# Patient Record
Sex: Male | Born: 1970 | Race: Asian | Hispanic: No | Marital: Married | Smoking: Never smoker
Health system: Southern US, Community
[De-identification: ages and names within clinical notes are randomized; demographics above are authoritative.]

## PROBLEM LIST (undated history)

## (undated) DIAGNOSIS — I2510 Atherosclerotic heart disease of native coronary artery without angina pectoris: Secondary | ICD-10-CM

## (undated) DIAGNOSIS — G4730 Sleep apnea, unspecified: Secondary | ICD-10-CM

## (undated) DIAGNOSIS — E119 Type 2 diabetes mellitus without complications: Secondary | ICD-10-CM

## (undated) DIAGNOSIS — E0590 Thyrotoxicosis, unspecified without thyrotoxic crisis or storm: Secondary | ICD-10-CM

## (undated) DIAGNOSIS — E785 Hyperlipidemia, unspecified: Secondary | ICD-10-CM

## (undated) HISTORY — DX: Type 2 diabetes mellitus without complications: E11.9

## (undated) HISTORY — DX: Atherosclerotic heart disease of native coronary artery without angina pectoris: I25.10

## (undated) HISTORY — DX: Thyrotoxicosis, unspecified without thyrotoxic crisis or storm: E05.90

## (undated) HISTORY — DX: Sleep apnea, unspecified: G47.30

## (undated) HISTORY — PX: UVULOPALATOPHARYNGOPLASTY: SHX827

## (undated) HISTORY — DX: Hyperlipidemia, unspecified: E78.5

---

## 2008-03-31 ENCOUNTER — Encounter (INDEPENDENT_AMBULATORY_CARE_PROVIDER_SITE_OTHER): Payer: Self-pay | Admitting: Otolaryngology

## 2008-03-31 ENCOUNTER — Ambulatory Visit (HOSPITAL_COMMUNITY): Admission: RE | Admit: 2008-03-31 | Discharge: 2008-04-01 | Payer: Self-pay | Admitting: *Deleted

## 2010-06-15 ENCOUNTER — Emergency Department (INDEPENDENT_AMBULATORY_CARE_PROVIDER_SITE_OTHER): Payer: Commercial Managed Care - PPO

## 2010-06-15 ENCOUNTER — Emergency Department (HOSPITAL_BASED_OUTPATIENT_CLINIC_OR_DEPARTMENT_OTHER)
Admission: EM | Admit: 2010-06-15 | Discharge: 2010-06-15 | Disposition: A | Discharge status: Home / Self Care | Payer: Commercial Managed Care - PPO | Attending: Emergency Medicine | Admitting: Emergency Medicine

## 2010-06-15 DIAGNOSIS — R109 Unspecified abdominal pain: Secondary | ICD-10-CM | POA: Insufficient documentation

## 2010-06-15 DIAGNOSIS — G4730 Sleep apnea, unspecified: Secondary | ICD-10-CM | POA: Insufficient documentation

## 2010-06-15 DIAGNOSIS — N201 Calculus of ureter: Secondary | ICD-10-CM

## 2010-06-15 DIAGNOSIS — N1330 Unspecified hydronephrosis: Secondary | ICD-10-CM | POA: Insufficient documentation

## 2010-06-15 DIAGNOSIS — N200 Calculus of kidney: Secondary | ICD-10-CM | POA: Insufficient documentation

## 2010-06-15 LAB — URINALYSIS, ROUTINE W REFLEX MICROSCOPIC
Leukocytes, UA: NEGATIVE
Specific Gravity, Urine: 1.024 (ref 1.005–1.030)
Urine Glucose, Fasting: NEGATIVE mg/dL
pH: 6 (ref 5.0–8.0)

## 2010-06-15 LAB — URINE MICROSCOPIC-ADD ON

## 2010-06-21 ENCOUNTER — Emergency Department (INDEPENDENT_AMBULATORY_CARE_PROVIDER_SITE_OTHER): Payer: Commercial Managed Care - PPO

## 2010-06-21 ENCOUNTER — Emergency Department (HOSPITAL_BASED_OUTPATIENT_CLINIC_OR_DEPARTMENT_OTHER)
Admission: EM | Admit: 2010-06-21 | Discharge: 2010-06-22 | Disposition: A | Discharge status: Home / Self Care | Payer: Commercial Managed Care - PPO | Attending: Emergency Medicine | Admitting: Emergency Medicine

## 2010-06-21 DIAGNOSIS — G4730 Sleep apnea, unspecified: Secondary | ICD-10-CM | POA: Insufficient documentation

## 2010-06-21 DIAGNOSIS — N201 Calculus of ureter: Secondary | ICD-10-CM

## 2010-06-21 DIAGNOSIS — R109 Unspecified abdominal pain: Secondary | ICD-10-CM

## 2010-06-21 DIAGNOSIS — N1330 Unspecified hydronephrosis: Secondary | ICD-10-CM | POA: Insufficient documentation

## 2010-06-21 LAB — BASIC METABOLIC PANEL
BUN: 10 mg/dL (ref 6–23)
Calcium: 9.2 mg/dL (ref 8.4–10.5)
Creatinine, Ser: 0.9 mg/dL (ref 0.4–1.5)
GFR calc non Af Amer: >60 mL/min (ref >60)
Glucose, Bld: 94 mg/dL (ref 70–99)
Potassium: 4.3 mEq/L (ref 3.5–5.1)

## 2010-06-21 MED ORDER — IOHEXOL 300 MG/ML  SOLN
100.0000 mL | Freq: Once | INTRAMUSCULAR | Status: AC | PRN
  Administered 2010-06-21: 100 mL via INTRAVENOUS

## 2010-06-22 LAB — URINALYSIS, ROUTINE W REFLEX MICROSCOPIC
Bilirubin Urine: NEGATIVE
Ketones, ur: 15 mg/dL — AB
Nitrite: NEGATIVE
pH: 6.5 (ref 5.0–8.0)

## 2010-09-10 NOTE — Op Note (Signed)
[name], Dennis Whitehead               ACCOUNT NO.:  [id number]   MEDICAL RECORD NO.:  [id number]          PATIENT TYPE:  OIB   LOCATION:  3307                         FACILITY:  [facility]   PHYSICIAN:  Kristine Garbe. Ezzard Standing, M.D.DATE OF BIRTH:  1970/12/13   DATE OF PROCEDURE:  03/31/2008  DATE OF DISCHARGE:                               OPERATIVE REPORT   PREOPERATIVE DIAGNOSES:  1. Obstructive sleep apnea.  2. Turbinate hypertrophy.   POSTOPERATIVE DIAGNOSES:  1. Obstructive sleep apnea.  2. Turbinate hypertrophy.   OPERATION PERFORMED:  Uvulopalatopharyngoplasty with tonsillectomy.  Bilateral inferior turbinate reductions.   SURGEON:  Kristine Garbe. Ezzard Standing, MD   ANESTHESIA:  General endotracheal.   COMPLICATIONS:  None.   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  He had a sleep test in January,  which showed apnea-hypopnea index of 80 and is presently using CPAP.  He  has lost some weight and wanted to see about having surgical procedure  to help improve his upper airway obstruction.  On exam, he has mild-to-  moderate septal deformity but large turbinates.  No polyps.  No  intranasal lesions noted.  Oral exam reveals general-sized tonsils along  the uvula and soft palate.  He was taken to the operating room this time  for uvulopalatopharyngoplasty with tonsillectomy and turbinate reduction  help improve his nasal airway.   DESCRIPTION OF PROCEDURE:  After adequate endotracheal anesthesia, the  patient received 10 mg of Decadron and 1 g Ancef IV preoperatively.  Nose was examined first.  Dennis Whitehead had a moderate septal deformity to the  left.  He had compensatory turbinate hypertrophy on the right side.  Using bipolar LMA cautery, submucosal cauterization of the inferior  turbinates were performed bilaterally.  The turbinates were  outfractured.  This completed the inferior turbinate reductions.  Following  this, a mouthgag was used to expose the oropharynx.  The left  and right tonsils were resected from tonsillar fossas using cautery.  After resecting the tonsils, the uvula was transected at its base.  About another 0.5 cm of soft palate was resected on either side of the  uvula.  In addition, there was a small papilloma on the right posterior  pharyngeal wall at the inferior aspect of the tonsil and this was  cauterized and burned.  Hemostasis was obtained with a cautery.  The  mucosa on either side of the palate was reapproximated with 3-0 Vicryl  and 3-0 chromic sutures.  Dennis Whitehead was awoke from anesthesia and  transferred to recovery room, postop doing well.   DISPOSITION:  Dennis Whitehead observed overnight for any airway problems and we  will plan to discharge in the morning on amoxicillin suspension 5 mg  b.i.d. for 1 week and Lortab Elixir 1 to 1-1/2 tablespoons q.4 h. p.r.n.  pain.  Further followup my office in 2 weeks for recheck.           ______________________________  Kristine Garbe. Ezzard Standing, M.D.     CEN/MEDQ  D:  03/31/2008  T:  03/31/2008  Job:  [id number]  cc:   Deirdre Peer. Polite, M.D.

## 2011-01-31 LAB — BASIC METABOLIC PANEL
CO2: 27 mEq/L (ref 19–32)
Chloride: 102 mEq/L (ref 96–112)
Creatinine, Ser: 0.95 mg/dL (ref 0.4–1.5)
GFR calc Af Amer: >60 mL/min (ref >60)

## 2011-01-31 LAB — CBC
Hemoglobin: 17.5 g/dL — ABNORMAL HIGH (ref 13.0–17.0)
MCHC: 33.4 g/dL (ref 30.0–36.0)
MCV: 92.2 fL (ref 78.0–100.0)
RBC: 5.68 MIL/uL (ref 4.22–5.81)

## 2011-01-31 LAB — PROTIME-INR: Prothrombin Time: 12.5 seconds (ref 11.6–15.2)

## 2012-04-09 IMAGING — CT CT ABD-PELV W/ CM
2 of 5 series · 16 of 46 positions shown, 18 images · IV contrast (APPLIED)
Comparison: Stone study of 06/15/2009

CLINICAL DATA: Lithotripsy today.  Left-sided flank pain.  Evaluate
for ureteral hematoma.

CT ABDOMEN AND PELVIS WITH CONTRAST
TECHNIQUE: Multidetector CT imaging of the abdomen and pelvis was
performed following the standard protocol during bolus
administration of intravenous contrast.
Contrast: 100  ml 9mnipaque-9PP

[Series 2: abd/pelvis 5.0 b31f · axial · 0.73mm/px · z∈[-521,-66]mm · 13 of 105 slices shown, 15 images]
[im 7/105  soft-tissue]
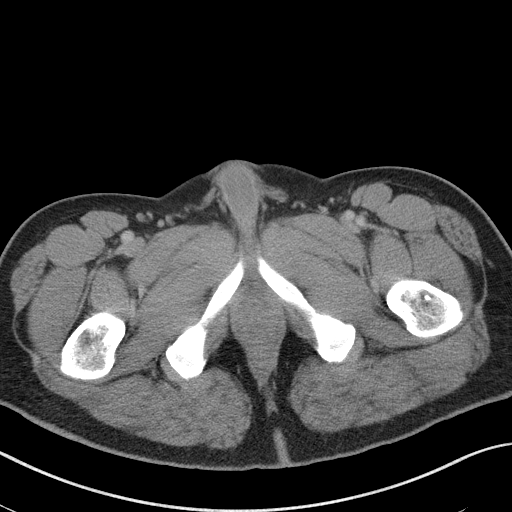
[im 7/105  bone]
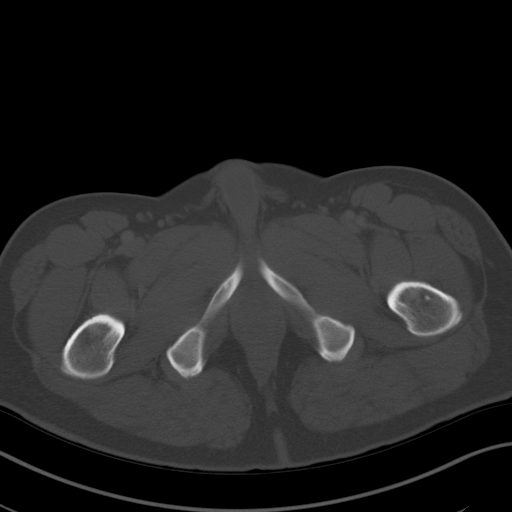
[im 14/105  soft-tissue]
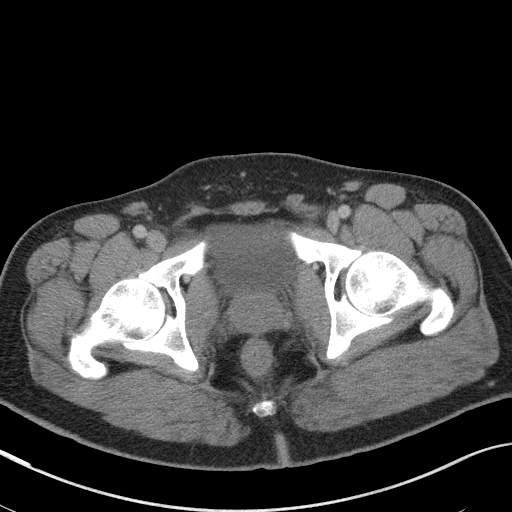
[im 21/105  soft-tissue]
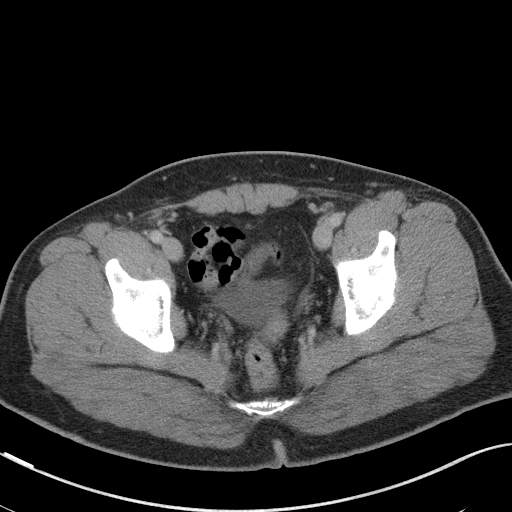
[im 28/105  soft-tissue]
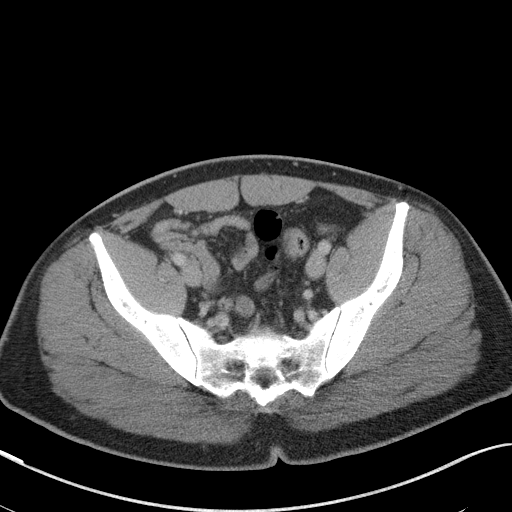
[im 35/105  soft-tissue]
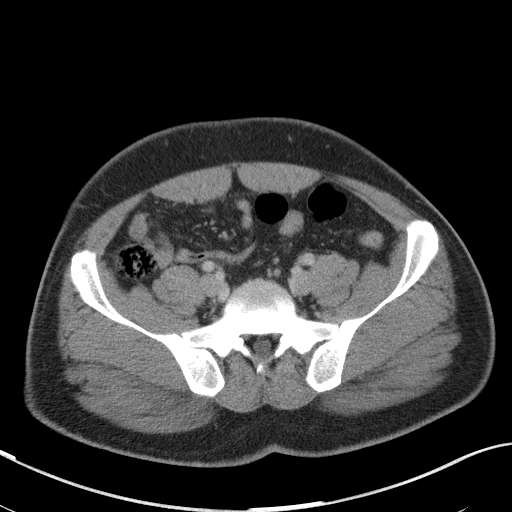
[im 42/105  soft-tissue]
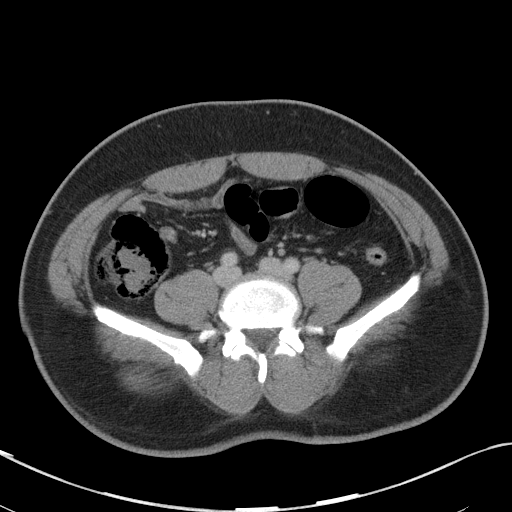
[im 56/105  soft-tissue]
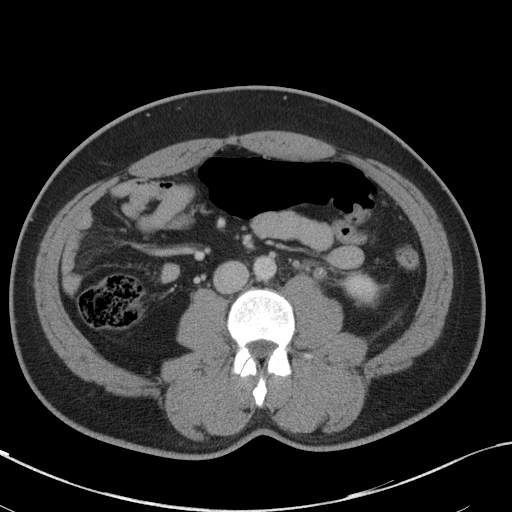
[im 63/105  soft-tissue]
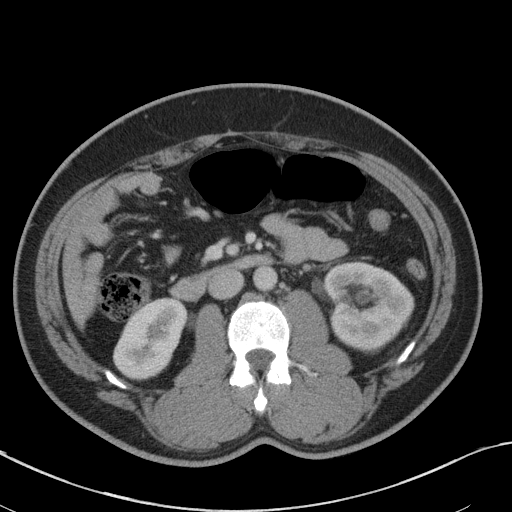
[im 70/105  soft-tissue]
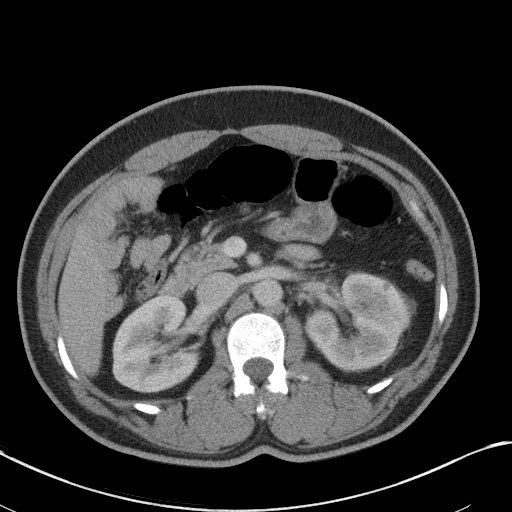
[im 70/105  bone]
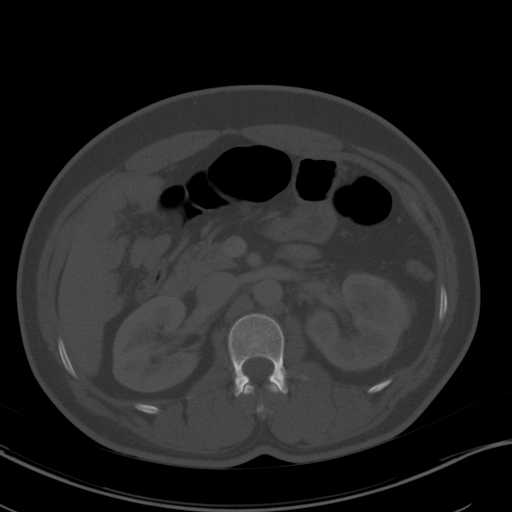
[im 77/105  soft-tissue]
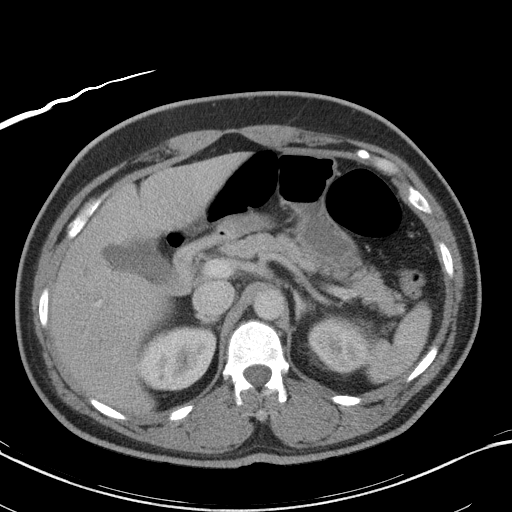
[im 84/105  soft-tissue]
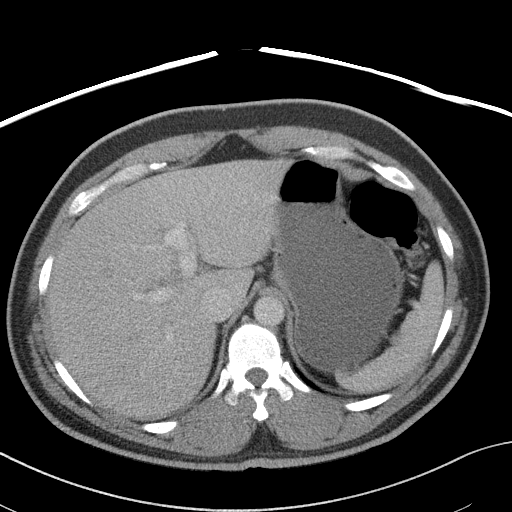
[im 91/105  soft-tissue]
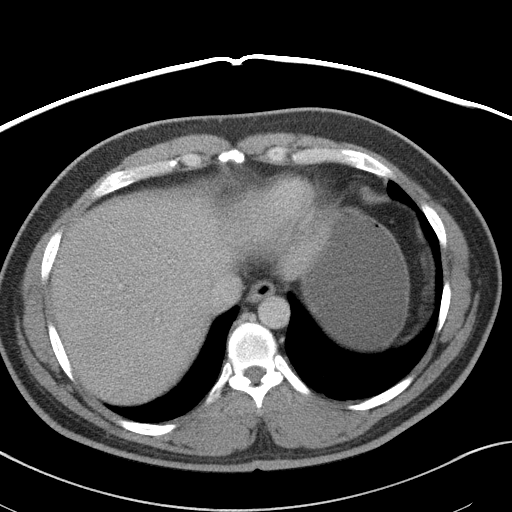
[im 98/105  soft-tissue]
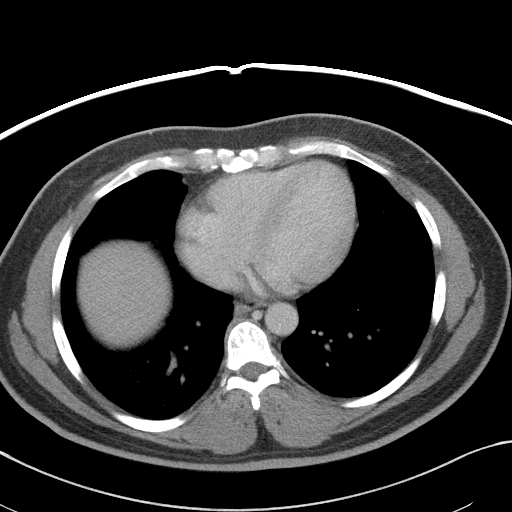

[Series 5: abd/pelvis 3.0 coronal · coronal · 0.87mm/px · 3 of 91 slices shown]
[im 31/91  soft-tissue]
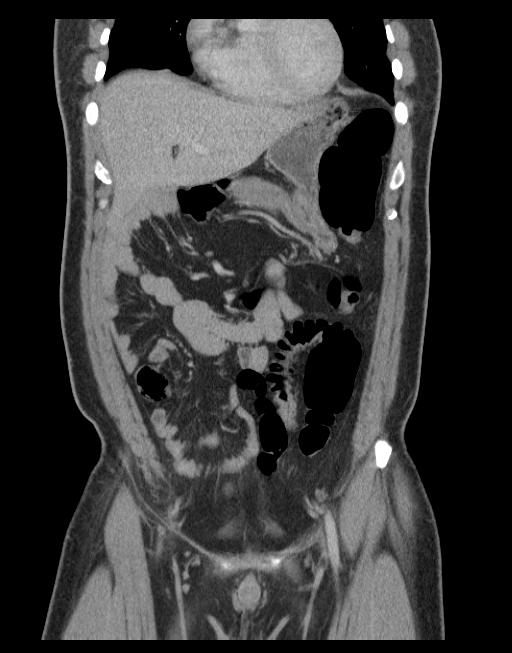
[im 41/91  soft-tissue]
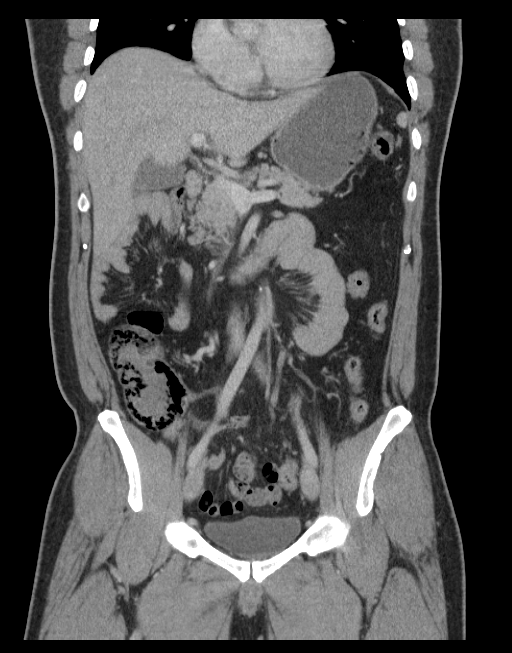
[im 51/91  soft-tissue]
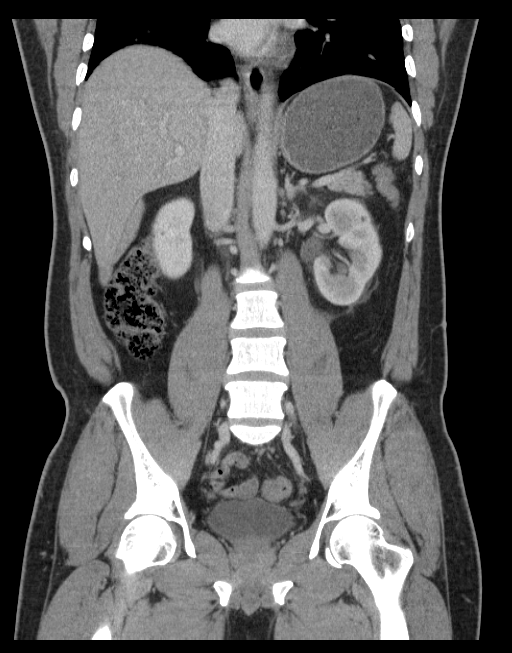

[16 of 46 positions shown; findings below may reference images not displayed]

FINDINGS: Clear lung bases.  Normal heart size without pericardial
or pleural effusion.  Normal liver, spleen, stomach, pancreas,
gallbladder, biliary tract, adrenal glands, right kidney.  Left
perienteric edema is slightly increased.  Sub cm low density left
renal lesions which are too small to characterize.

Mild left-sided urinary tract obstruction and  delayed contrast
excretion.  There are now numerous small stone fragments within the
proximal left ureter.  Images 49 - 51.  The largest measures 4 mm.
No definite distal ureteric stone.

No retroperitoneal or retrocrural adenopathy.

Normal colon.  There are small bowel loops which are positioned
anteriorly within the right abdomen on image 47.  No obstruction.
No ascites. No pelvic adenopathy.    Normal urinary bladder and
prostate.  No significant free fluid.  No acute osseous
abnormality.
IMPRESSION: 1.  Interval fragmentation of the proximal left ureteric stone.
Stone fragments are identified in the proximal left ureter with
increased urinary tract obstruction and delayed contrast excretion.
2.  No evidence of perinephric hematoma or other acute
complication.
3.  Atypical superficial/lateral position of small bowel loops in
the right abdomen suggests internal hernia.  No evidence of
obstruction or other acute complication.

## 2017-09-28 ENCOUNTER — Ambulatory Visit
Admission: RE | Admit: 2017-09-28 | Discharge: 2017-09-28 | Disposition: A | Discharge status: Home / Self Care | Payer: Commercial Managed Care - PPO | Source: Ambulatory Visit | Attending: Emergency Medicine | Admitting: Emergency Medicine

## 2017-09-28 ENCOUNTER — Other Ambulatory Visit: Payer: Self-pay | Admitting: Emergency Medicine

## 2017-09-28 DIAGNOSIS — M545 Low back pain: Principal | ICD-10-CM

## 2017-09-28 DIAGNOSIS — G8929 Other chronic pain: Secondary | ICD-10-CM

## 2017-09-28 MED ORDER — METHYLPREDNISOLONE ACETATE 40 MG/ML INJ SUSP (RADIOLOG
120.0000 mg | Freq: Once | INTRAMUSCULAR | Status: AC
  Administered 2017-09-28: 120 mg via EPIDURAL

## 2017-09-28 MED ORDER — IOPAMIDOL (ISOVUE-M 200) INJECTION 41%
1.0000 mL | Freq: Once | INTRAMUSCULAR | Status: AC
  Administered 2017-09-28: 1 mL via EPIDURAL

## 2017-09-28 NOTE — Discharge Instructions (Signed)

## 2022-03-24 ENCOUNTER — Ambulatory Visit: Payer: BC Managed Care – PPO | Attending: Cardiology | Admitting: Cardiology

## 2022-03-24 ENCOUNTER — Encounter: Payer: Self-pay | Admitting: Cardiology

## 2022-03-24 VITALS — BP 110/80 | HR 84 | Ht 74.0 in | Wt 177.0 lb

## 2022-03-24 DIAGNOSIS — E785 Hyperlipidemia, unspecified: Secondary | ICD-10-CM

## 2022-03-24 DIAGNOSIS — G4733 Obstructive sleep apnea (adult) (pediatric): Secondary | ICD-10-CM

## 2022-03-24 DIAGNOSIS — E119 Type 2 diabetes mellitus without complications: Secondary | ICD-10-CM | POA: Diagnosis not present

## 2022-03-24 DIAGNOSIS — R072 Precordial pain: Secondary | ICD-10-CM

## 2022-03-24 DIAGNOSIS — I10 Essential (primary) hypertension: Secondary | ICD-10-CM | POA: Diagnosis not present

## 2022-03-24 DIAGNOSIS — I2510 Atherosclerotic heart disease of native coronary artery without angina pectoris: Secondary | ICD-10-CM

## 2022-03-24 MED ORDER — METOPROLOL TARTRATE 100 MG PO TABS: 100.0000 mg | ORAL_TABLET | Freq: Once | ORAL | 0 refills | Status: DC

## 2022-03-24 NOTE — Progress Notes (Unsigned)
Cardiology Consultation:    Date:  03/24/2022   ID:  Dennis Whitehead, DOB July 24, 1970, MRN [id number]  PCP:  Patient, No Pcp Per  Cardiologist:  Jenne Campus, MD   Referring MD: No ref. provider found   No chief complaint on file. I am having some shortness of breath and atypical chest pain  History of Present Illness:    Dennis Whitehead is a 51 y.o. male who is being seen today for the evaluation of shortness of breath chest pain at the request of No ref. provider found.  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  Possible to go history significant for coronary artery disease.  In November 2021 he had cardiac catheterization done which showed 30% diagonal branch disease and 35% of proximal circumflex.  Additional problem include diabetes, dyslipidemia, obesity, obstructive sleep apnea. He came to me because he noticed more shortness of breath as well as some atypical chest pain not related to exercise.  With his known history of coronary artery disease as well as multiple risk factors he would like to be checked make sure everything is fine.  He does not exercise on the regular basis.  There was some changes in his diabetic medications which led to significant weight loss and he is feeling much better but still have some symptoms and he is already about that He never smoked does have family history of coronary artery disease does not exercise on the regular basis trying to use a lower calorie intake diet   Past Medical History:  Diagnosis Date   CAD (coronary artery disease)    Diabetes type 2, controlled ([place])    Hyperlipidemia    Hyperthyroidism    Sleep apnea     Past Surgical History:  Procedure Laterality Date   UVULOPALATOPHARYNGOPLASTY Bilateral     Current Medications: Current Meds  Medication Sig   aspirin EC 81 MG tablet Take by mouth.   atorvastatin (LIPITOR) 80 MG tablet Take 80 mg by mouth daily.   glucose blood (ONETOUCH VERIO) test strip Use 3  times daily   Insulin Pen Needle (NOVOFINE) 32G X 6 MM MISC USE AS DIRECTED   levothyroxine (SYNTHROID) 25 MCG tablet Take 25 mcg by mouth daily before breakfast.   lisinopril (ZESTRIL) 5 MG tablet Take 1 tablet by mouth daily.   tadalafil (CIALIS) 5 MG tablet Take 5 mg by mouth daily as needed for erectile dysfunction.   tirzepatide (MOUNJARO) 15 MG/0.5ML Pen Inject 15 mg into the skin once a week.     Allergies:   Patient has no known allergies.   Social History   Socioeconomic History   Marital status: Married    Spouse name: Not on file   Number of children: Not on file   Years of education: Not on file   Highest education level: Not on file  Occupational History   Not on file  Tobacco Use   Smoking status: Never   Smokeless tobacco: Never  Vaping Use   Vaping Use: Never used  Substance and Sexual Activity   Alcohol use: Never   Drug use: Never   Sexual activity: Not on file  Other Topics Concern   Not on file  Social History Narrative   Not on file   Social Determinants of Health   Financial Resource Strain: Not on file  Food Insecurity: Not on file  Transportation Needs: Not on file  Physical Activity: Not on file  Stress: Not on file  Social  Connections: Not on file     Family History: The patient's family history includes Diabetes in his father; Stroke in his paternal grandfather. ROS:   Please see the history of present illness.    All 14 point review of systems negative except as described per history of present illness.  EKGs/Labs/Other Studies Reviewed:    The following studies were reviewed today: Cardiac catheterization reviewed from 2021  EKG:  EKG is  ordered today.  The ekg ordered today demonstrates normal sinus rhythm, normal P interval, normal QS complex duration fulgent no ST segment changes  Recent Labs: No results found for requested labs within last 365 days.  Recent Lipid Panel No results found for: "CHOL", "TRIG", "HDL", "CHOLHDL",  "VLDL", "LDLCALC", "LDLDIRECT"  Physical Exam:    VS:  BP 110/80 (BP Location: Right Arm, Patient Position: Sitting, Cuff Size: Normal)   Pulse 84   Ht 6\' 2"  (1.88 m)   Wt 177 lb (80.3 kg)   SpO2 99%   BMI 22.73 kg/m     Wt Readings from Last 3 Encounters:  03/24/22 177 lb (80.3 kg)     GEN:  Well nourished, well developed in no acute distress HEENT: Normal NECK: No JVD; No carotid bruits LYMPHATICS: No lymphadenopathy CARDIAC: RRR, no murmurs, no rubs, no gallops RESPIRATORY:  Clear to auscultation without rales, wheezing or rhonchi  ABDOMEN: Soft, non-tender, non-distended MUSCULOSKELETAL:  No edema; No deformity  SKIN: Warm and dry NEUROLOGIC:  Alert and oriented x 3 PSYCHIATRIC:  Normal affect   ASSESSMENT:    1. Coronary artery disease involving native coronary artery of native heart without angina pectoris   2. Type 2 diabetes mellitus without complication, without long-term current use of insulin (HCC)   3. Essential hypertension   4. Dyslipidemia   5. Precordial pain   6. Obstructive sleep apnea    PLAN:    In order of problems listed above:  Atypical symptoms in this gentleman with multiple risk factors for coronary artery disease and already established coronary artery disease.  I will ask him to have coronary CT angio to rule out worsening of the problem.  Will continue antiplatelet therapy as well as high intensity statin Dyslipidemia he is on high intense statin which I will continue.  He is target LDL is less than 70 any we already there.  However advised to start doing exercises on the regular basis which should help with cholesterol as well Obstructive sleep apnea with CPAP on the regular basis Diabetes much better controlled right now with hemoglobin A1c less than 6 followed by endocrinology   Medication Adjustments/Labs and Tests Ordered: Current medicines are reviewed at length with the patient today.  Concerns regarding medicines are outlined above.   No orders of the defined types were placed in this encounter.  No orders of the defined types were placed in this encounter.   Signed, 03/26/22, MD, [facility]. 03/24/2022 12:30 PM    [facility]

## 2022-03-24 NOTE — Patient Instructions (Signed)
Medication Instructions:  Your physician recommends that you continue on your current medications as directed. Please refer to the Current Medication list given to you today.  *If you need a refill on your cardiac medications before your next appointment, please call your pharmacy*   Lab Work: Your physician recommends that you return for lab work in:   Labs 1 week before CT: BMP  If you have labs (blood work) drawn today and your tests are completely normal, you will receive your results only by: [place] (if you have MyChart) OR A paper copy in the mail If you have any lab test that is abnormal or we need to change your treatment, we will call you to review the results.   Testing/Procedures:   Your cardiac CT will be scheduled at one of the below locations:   [facility] [address] [place], [place] [address] [phone] [place] [address] [place], [place] [address] [phone]  [facility] [place],  [address] [phone]  If scheduled at [facility], please arrive at the [facility] and Children's Entrance (Entrance C2) of [facility] 30 minutes prior to test start time. You can use the FREE valet parking offered at entrance C (encouraged to control the heart rate for the test)  Proceed to the [facility] Radiology Department (first floor) to check-in and test prep.  All radiology patients and guests should use entrance C2 at [facility], accessed from [facility], even though the hospital's physical address listed is [address]    If scheduled at [facility] or [facility], please arrive 15 mins early for check-in and test prep.   Please follow these instructions carefully (unless otherwise directed):  Hold all erectile dysfunction  medications at least 3 days (72 hrs) prior to test. (Ie viagra, cialis, sildenafil, tadalafil, etc) We will administer nitroglycerin during this exam.   On the Night Before the Test: Be sure to Drink plenty of water. Do not consume any caffeinated/decaffeinated beverages or chocolate 12 hours prior to your test. Do not take any antihistamines 12 hours prior to your test.  On the Day of the Test: Drink plenty of water until 1 hour prior to the test. Do not eat any food 1 hour prior to test. You may take your regular medications prior to the test.  Take metoprolol (Lopressor) two hours prior to test.      After the Test: Drink plenty of water. After receiving IV contrast, you may experience a mild flushed feeling. This is normal. On occasion, you may experience a mild rash up to 24 hours after the test. This is not dangerous. If this occurs, you can take Benadryl 25 mg and increase your fluid intake. If you experience trouble breathing, this can be serious. If it is severe call 911 IMMEDIATELY. If it is mild, please call our office. If you take any of these medications: Glipizide/Metformin, Avandament, Glucavance, please do not take 48 hours after completing test unless otherwise instructed.  We will call to schedule your test 2-4 weeks out understanding that some insurance companies will need an authorization prior to the service being performed.   For non-scheduling related questions, please contact the cardiac imaging nurse navigator should you have any questions/concerns: Marchia Bond, Cardiac Imaging Nurse Navigator Gordy Clement, Cardiac Imaging Nurse Navigator  Heart  and Vascular Services Direct Office Dial: [phone]   For scheduling needs, including cancellations and rescheduling, please call [place], [phone].    Follow-Up: At [facility], you and your health needs are our priority.  As part of our continuing mission to provide you with  exceptional heart care, we have created designated Provider Care Teams.  These Care Teams include your primary Cardiologist (physician) and Advanced Practice Providers (APPs -  Physician Assistants and Nurse Practitioners) who all work together to provide you with the care you need, when you need it.  We recommend signing up for the patient portal called "MyChart".  Sign up information is provided on this After Visit Summary.  MyChart is used to connect with patients for Virtual Visits (Telemedicine).  Patients are able to view lab/test results, encounter notes, upcoming appointments, etc.  Non-urgent messages can be sent to your provider as well.   To learn more about what you can do with MyChart, go to [email or web address].    Your next appointment:   6 month(s)  The format for your next appointment:   In Person  Provider:   Gypsy Balsam, MD    Other Instructions None  Important Information About Sugar

## 2022-04-07 ENCOUNTER — Telehealth (HOSPITAL_COMMUNITY): Payer: Self-pay | Admitting: *Deleted

## 2022-04-07 NOTE — Telephone Encounter (Signed)
Reaching out to patient to offer assistance regarding upcoming cardiac imaging study; pt verbalizes understanding of appt date/time, parking situation and where to check in, medications ordered, and verified current allergies; name and call back number provided for further questions should they arise  Larey Brick RN Navigator Cardiac Imaging [facility] [phone] office [phone] cell  Patient aware to obtain labs prior to this cardiac CT scan.  He will take 100mg  metoprolol tartrate two hours prior to his cardiac CT scan and arrive at 9:30am.

## 2022-04-08 LAB — BASIC METABOLIC PANEL
BUN/Creatinine Ratio: 14 (ref 9–20)
BUN: 15 mg/dL (ref 6–24)
CO2: 25 mmol/L (ref 20–29)
Calcium: 9.8 mg/dL (ref 8.7–10.2)
Chloride: 101 mmol/L (ref 96–106)
Creatinine, Ser: 1.1 mg/dL (ref 0.76–1.27)
Glucose: 79 mg/dL (ref 70–99)
Potassium: 4.9 mmol/L (ref 3.5–5.2)
Sodium: 140 mmol/L (ref 134–144)
eGFR: 81 mL/min/{1.73_m2} (ref >59)

## 2022-04-09 ENCOUNTER — Telehealth: Payer: Self-pay

## 2022-04-09 ENCOUNTER — Ambulatory Visit (HOSPITAL_COMMUNITY)
Admission: RE | Admit: 2022-04-09 | Discharge: 2022-04-09 | Disposition: A | Discharge status: Home / Self Care | Payer: BC Managed Care – PPO | Source: Ambulatory Visit | Attending: Cardiology | Admitting: Cardiology

## 2022-04-09 ENCOUNTER — Encounter (HOSPITAL_COMMUNITY): Payer: Self-pay

## 2022-04-09 DIAGNOSIS — R072 Precordial pain: Secondary | ICD-10-CM | POA: Insufficient documentation

## 2022-04-09 MED ORDER — METOPROLOL TARTRATE 5 MG/5ML IV SOLN
INTRAVENOUS | Status: AC
  Filled 2022-04-09: qty 10

## 2022-04-09 MED ORDER — DILTIAZEM HCL 25 MG/5ML IV SOLN
5.0000 mg | INTRAVENOUS | Status: DC | PRN
  Administered 2022-04-09 (×2): 5 mg via INTRAVENOUS

## 2022-04-09 MED ORDER — IVABRADINE HCL 5 MG PO TABS: ORAL_TABLET | ORAL | 0 refills | Status: AC

## 2022-04-09 MED ORDER — METOPROLOL TARTRATE 100 MG PO TABS: ORAL_TABLET | ORAL | 0 refills | Status: AC

## 2022-04-09 MED ORDER — DILTIAZEM HCL 25 MG/5ML IV SOLN
INTRAVENOUS | Status: AC
  Filled 2022-04-09: qty 5

## 2022-04-09 MED ORDER — NITROGLYCERIN 0.4 MG SL SUBL: 0.8000 mg | SUBLINGUAL_TABLET | Freq: Once | SUBLINGUAL | Status: DC

## 2022-04-09 MED ORDER — METOPROLOL TARTRATE 5 MG/5ML IV SOLN
5.0000 mg | INTRAVENOUS | Status: DC | PRN
  Administered 2022-04-09: 5 mg via INTRAVENOUS

## 2022-04-09 MED ORDER — IOHEXOL 350 MG/ML SOLN: 95.0000 mL | Freq: Once | INTRAVENOUS | Status: DC | PRN

## 2022-04-09 NOTE — Addendum Note (Signed)
Addended by: Baldo Ash D on: 04/09/2022 10:46 AM   Modules accepted: Orders

## 2022-04-09 NOTE — Progress Notes (Signed)
Took Metoprolol 100 mg this am at 0600. Sinus rhythm on arrival to Nurses's Station was 80-90 and no change with breath hold. BP 108 81. After Metoprolol 5mg  IV and Diltiazem 10mg  total IV BP 91/64. Feels fine. Dr called and notified re above. CT heart cancelled and will be rescheduled with Ivabradine and Metoprolol. Patient talked to Dr. on the phone re this. Bing Matter RN notified of this as well. Patient discharged walking and feels fine. Last BP 100/67.

## 2022-04-09 NOTE — Telephone Encounter (Signed)
Spoke with pt and explained Metoprolol and Ivabradine 2 hours prior to CT. Order sent to reschedule CT scan.

## 2022-04-10 ENCOUNTER — Ambulatory Visit (HOSPITAL_COMMUNITY)
Admission: RE | Admit: 2022-04-10 | Discharge: 2022-04-10 | Disposition: A | Discharge status: Home / Self Care | Payer: BC Managed Care – PPO | Source: Ambulatory Visit | Attending: Cardiology | Admitting: Cardiology

## 2022-04-10 ENCOUNTER — Ambulatory Visit (HOSPITAL_BASED_OUTPATIENT_CLINIC_OR_DEPARTMENT_OTHER)
Admission: RE | Admit: 2022-04-10 | Discharge: 2022-04-10 | Disposition: A | Discharge status: Home / Self Care | Payer: BC Managed Care – PPO | Source: Ambulatory Visit | Attending: Cardiology | Admitting: Cardiology

## 2022-04-10 ENCOUNTER — Other Ambulatory Visit: Payer: Self-pay | Admitting: Cardiology

## 2022-04-10 DIAGNOSIS — R072 Precordial pain: Secondary | ICD-10-CM | POA: Diagnosis present

## 2022-04-10 DIAGNOSIS — R931 Abnormal findings on diagnostic imaging of heart and coronary circulation: Secondary | ICD-10-CM | POA: Diagnosis present

## 2022-04-10 DIAGNOSIS — I2510 Atherosclerotic heart disease of native coronary artery without angina pectoris: Secondary | ICD-10-CM

## 2022-04-10 MED ORDER — IOHEXOL 350 MG/ML SOLN
100.0000 mL | Freq: Once | INTRAVENOUS | Status: AC | PRN
  Administered 2022-04-10: 100 mL via INTRAVENOUS

## 2022-04-10 MED ORDER — NITROGLYCERIN 0.4 MG SL SUBL
SUBLINGUAL_TABLET | SUBLINGUAL | Status: AC
  Filled 2022-04-10: qty 2

## 2022-04-10 MED ORDER — NITROGLYCERIN 0.4 MG SL SUBL
0.8000 mg | SUBLINGUAL_TABLET | Freq: Once | SUBLINGUAL | Status: AC
  Administered 2022-04-10: 0.8 mg via SUBLINGUAL

## 2022-04-10 MED ORDER — METOPROLOL TARTRATE 5 MG/5ML IV SOLN
INTRAVENOUS | Status: AC
  Administered 2022-04-10 (×2): 10 mg
  Filled 2022-04-10: qty 10

## 2022-04-10 NOTE — Progress Notes (Signed)
FFR Order 

## 2022-04-16 ENCOUNTER — Other Ambulatory Visit: Payer: Self-pay

## 2022-04-16 MED ORDER — EZETIMIBE 10 MG PO TABS: 10.0000 mg | ORAL_TABLET | Freq: Every day | ORAL | 3 refills | Status: AC

## 2022-04-16 NOTE — Telephone Encounter (Signed)
Zetia 10mg  per Dr. note
